# Patient Record
Sex: Male | Born: 2000 | Race: White | Hispanic: No | Marital: Single | Smoking: Light tobacco smoker
Health system: Southern US, Community
[De-identification: ages and names within clinical notes are randomized; demographics above are authoritative.]

## PROBLEM LIST (undated history)

## (undated) DIAGNOSIS — F419 Anxiety disorder, unspecified: Secondary | ICD-10-CM

## (undated) DIAGNOSIS — F329 Major depressive disorder, single episode, unspecified: Secondary | ICD-10-CM

## (undated) DIAGNOSIS — F32A Depression, unspecified: Secondary | ICD-10-CM

## (undated) HISTORY — DX: Depression, unspecified: F32.A

## (undated) HISTORY — DX: Anxiety disorder, unspecified: F41.9

---
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
## 2018-01-03 ENCOUNTER — Other Ambulatory Visit: Payer: Self-pay

## 2018-01-03 ENCOUNTER — Emergency Department
Admission: EM | Admit: 2018-01-03 | Discharge: 2018-01-04 | Disposition: A | Discharge status: Home / Self Care | Payer: Self-pay | Attending: Emergency Medicine | Admitting: Emergency Medicine

## 2018-01-03 ENCOUNTER — Emergency Department: Payer: Self-pay

## 2018-01-03 DIAGNOSIS — R002 Palpitations: Secondary | ICD-10-CM | POA: Insufficient documentation

## 2018-01-03 DIAGNOSIS — F17200 Nicotine dependence, unspecified, uncomplicated: Secondary | ICD-10-CM | POA: Insufficient documentation

## 2018-01-03 LAB — BASIC METABOLIC PANEL
ANION GAP: 9 (ref 5–15)
BUN: 19 mg/dL (ref 6–20)
CALCIUM: 9.3 mg/dL (ref 8.9–10.3)
CO2: 29 mmol/L (ref 22–32)
CREATININE: 1.07 mg/dL (ref 0.61–1.24)
Chloride: 104 mmol/L (ref 98–111)
Glucose, Bld: 93 mg/dL (ref 70–99)
Potassium: 3.8 mmol/L (ref 3.5–5.1)
Sodium: 142 mmol/L (ref 135–145)

## 2018-01-03 LAB — CBC
HCT: 41.6 % (ref 39.0–52.0)
Hemoglobin: 14 g/dL (ref 13.0–17.0)
MCH: 28.4 pg (ref 26.0–34.0)
MCHC: 33.7 g/dL (ref 30.0–36.0)
MCV: 84.4 fL (ref 80.0–100.0)
NRBC: 0 % (ref 0.0–0.2)
PLATELETS: 248 10*3/uL (ref 150–400)
RBC: 4.93 MIL/uL (ref 4.22–5.81)
RDW: 12.1 % (ref 11.5–15.5)
WBC: 8.7 10*3/uL (ref 4.0–10.5)

## 2018-01-03 LAB — TROPONIN I

## 2018-01-03 NOTE — ED Provider Notes (Signed)
[facility] Emergency Department Provider Note  ____________________________________________   First MD Initiated Contact with Patient 01/03/18 2345     (approximate)  I have reviewed the triage vital signs and the nursing notes.   HISTORY  Chief Complaint Chest Pain   HPI Jeff Turner is a 17 y.o. male who self presents to the emergency department with palpitations chest pain shortness of breath bilateral hand numbness that began earlier today.  His symptoms seem to be worse with exertion.  He has had 3 or 4 episodes.  He has not actually passed out.  He has no family history of sudden cardiac death.  When the episodes happen he feels like his heart is racing.  He does have a history of anxiety but this feels different from previous panic attacks.  No history of DVT or pulmonary embolism.  No hemoptysis.  No recent surgery travel or immobilization.  No leg swelling.   History reviewed. No pertinent past medical history.  There are no active problems to display for this patient.   History reviewed. No pertinent surgical history.  Prior to Admission medications   Not on File    Allergies Sulfa antibiotics  History reviewed. No pertinent family history.  Social History Social History   Tobacco Use  . Smoking status: Current Every Day Smoker  Substance Use Topics  . Alcohol use: Not on file  . Drug use: Not on file    Review of Systems Constitutional: No fever/chills Eyes: No visual changes. ENT: No sore throat. Cardiovascular: Positive for chest pain. Respiratory: Positive for shortness of breath. Gastrointestinal: No abdominal pain.  No nausea, no vomiting.  No diarrhea.  No constipation. Genitourinary: Negative for dysuria. Musculoskeletal: Negative for back pain. Skin: Negative for rash. Neurological: Positive for focal numbness   ____________________________________________   PHYSICAL EXAM:  VITAL SIGNS: ED Triage Vitals    Enc Vitals Group     BP 01/03/18 1907 117/68     Pulse Rate 01/03/18 1907 78     Resp 01/03/18 1907 16     Temp 01/03/18 1907 99 F (37.2 C)     Temp Source 01/03/18 1907 Oral     SpO2 01/03/18 1907 98 %     Weight 01/03/18 1908 155 lb (70.3 kg)     Height 01/03/18 1908 5\' 8"  (1.727 m)     Head Circumference --      Peak Flow --      Pain Score 01/03/18 1907 3     Pain Loc --      Pain Edu? --      Excl. in GC? --     Constitutional: Alert and oriented x4 mildly anxious appearing although nontoxic no diaphoresis and speaks in full clear sentences Eyes: PERRL EOMI. Head: Atraumatic. Nose: No congestion/rhinnorhea. Mouth/Throat: No trismus Neck: No stridor.   Cardiovascular: Normal rate, regular rhythm. Grossly normal heart sounds.  Good peripheral circulation.  Specifically no left ear murmur Respiratory: Normal respiratory effort.  No retractions. Lungs CTAB and moving good air Gastrointestinal: Soft nontender Musculoskeletal: No lower extremity edema   Neurologic:  Normal speech and language. No gross focal neurologic deficits are appreciated. Skin:  Skin is warm, dry and intact. No rash noted. Psychiatric: Mildly anxious appearing    ____________________________________________   DIFFERENTIAL includes but not limited to  SVT, atrial fibrillation, ventricular tachycardia, panic attack, pulmonary embolism ____________________________________________   LABS (all labs ordered are listed, but only abnormal results are displayed)  Labs  Reviewed  BASIC METABOLIC PANEL  CBC  TROPONIN I    Lab work reviewed by me with no signs of acute ischemia and normal electrolytes __________________________________________  EKG  ED ECG REPORT I, Merrily Brittle, the attending physician, personally viewed and interpreted this ECG.  Date: 01/03/2018 EKG Time:  Rate: 81 Rhythm: normal sinus rhythm QRS Axis: normal Intervals: normal ST/T Wave abnormalities: normal Narrative  Interpretation: no evidence of acute ischemia  ____________________________________________  RADIOLOGY  Chest x-ray reviewed by me with no acute disease ____________________________________________   PROCEDURES  Procedure(s) performed: no  Procedures  Critical Care performed: no  ____________________________________________   INITIAL IMPRESSION / ASSESSMENT AND PLAN / ED COURSE  Pertinent labs & imaging results that were available during my care of the patient were reviewed by me and considered in my medical decision making (see chart for details).   As part of my medical decision making, I reviewed the following data within the electronic MEDICAL RECORD NUMBER History obtained from family if available, nursing notes, old chart and ekg, as well as notes from prior ED visits.  Patient comes to the emergency department with multiple discrete short-lived episodes of palpitations and lightheadedness.  He has subsequent tingling in his fingers which is likely secondary to hyperventilation.  Had a lengthy discussion with the patient and family regarding the difficulty of diagnosing palpitations without ever catching them on the monitor.  He has no family history of sudden cardiac death.  He has no concerning murmurs.  I placed him on monitor and he was observed for more than an hour with no ectopy captured.  I do think is reasonable and a good idea for him to follow-up with cardiology to consider a Holter monitor.  He understands to return should his symptoms worsen or should he actually pass out.  I asked him to try to keep a record of how often these episodes happen and their duration in addition to checking his pulse when the episodes occur.  This is not as good as a monitor by any means but her palpitations with a heart rate of 80 is quite different from palpitations of the heart rate of 150.  He is discharged with family in improved condition.       ____________________________________________   FINAL CLINICAL IMPRESSION(S) / ED DIAGNOSES  Final diagnoses:  Palpitations      NEW MEDICATIONS STARTED DURING THIS VISIT:  There are no discharge medications for this patient.    Note:  This document was prepared using Dragon voice recognition software and may include unintentional dictation errors.     Merrily Brittle, MD 01/06/18 [phone]

## 2018-01-03 NOTE — ED Triage Notes (Signed)
Pt states "a little bit of everything" when asked to elaborate family member states CP, SOB, numbness in arms. Hx anxiety. Pt states is different. + smoker. A&O, ambulatory. No distress noted.

## 2018-01-04 NOTE — Discharge Instructions (Signed)
Fortunately today your blood work, your EKG, your chest x-ray, and your monitoring was reassuring.  I do not know exactly why you had these palpitations but I think it is important for you to follow-up with a cardiologist for a recheck within a week or so.  Please return to the emergency department sooner for any concerns particularly if your symptoms worsen or if you pass out.  It was a pleasure to take care of you today, and thank you for coming to our emergency department.  If you have any questions or concerns before leaving please ask the nurse to grab me and I'm more than happy to go through your aftercare instructions again.  If you were prescribed any opioid pain medication today such as Norco, Vicodin, Percocet, morphine, hydrocodone, or oxycodone please make sure you do not drive when you are taking this medication as it can alter your ability to drive safely.  If you have any concerns once you are home that you are not improving or are in fact getting worse before you can make it to your follow-up appointment, please do not hesitate to call 911 and come back for further evaluation.  Merrily Brittle, MD  Results for orders placed or performed during the hospital encounter of 01/03/18  Basic metabolic panel  Result Value Ref Range   Sodium 142 135 - 145 mmol/L   Potassium 3.8 3.5 - 5.1 mmol/L   Chloride 104 98 - 111 mmol/L   CO2 29 22 - 32 mmol/L   Glucose, Bld 93 70 - 99 mg/dL   BUN 19 6 - 20 mg/dL   Creatinine, Ser 1.61 0.61 - 1.24 mg/dL   Calcium 9.3 8.9 - 09.6 mg/dL   GFR calc non Af Amer >60 >60 mL/min   GFR calc Af Amer >60 >60 mL/min   Anion gap 9 5 - 15  CBC  Result Value Ref Range   WBC 8.7 4.0 - 10.5 K/uL   RBC 4.93 4.22 - 5.81 MIL/uL   Hemoglobin 14.0 13.0 - 17.0 g/dL   HCT 04.5 40.9 - 81.1 %   MCV 84.4 80.0 - 100.0 fL   MCH 28.4 26.0 - 34.0 pg   MCHC 33.7 30.0 - 36.0 g/dL   RDW 91.4 78.2 - 95.6 %   Platelets 248 150 - 400 K/uL   nRBC 0.0 0.0 - 0.2 %  Troponin I   Result Value Ref Range   Troponin I <0.03 <0.03 ng/mL   Dg Chest 2 View  Result Date: 01/03/2018 CLINICAL DATA:  Chest pain and shortness of breath EXAM: CHEST - 2 VIEW COMPARISON:  None. FINDINGS: The heart size and mediastinal contours are within normal limits. Both lungs are clear. The visualized skeletal structures are unremarkable. IMPRESSION: No active cardiopulmonary disease. Electronically Signed   By: Alcide Clever M.D.   On: 01/03/2018 20:21

## 2018-01-05 ENCOUNTER — Telehealth: Payer: Self-pay

## 2018-01-05 NOTE — Telephone Encounter (Signed)
Lmov for patient to call and schedule ED fu appointment °They were seen on 01/03/18 for CP °

## 2018-01-07 NOTE — Telephone Encounter (Signed)
Lmov for patient to call and schedule ED fu appointment °They were seen on 01/03/18 for CP °

## 2018-01-11 NOTE — Telephone Encounter (Signed)
Lmov for patient to call and schedule ED fu appointment °They were seen on 01/03/18 for CP °

## 2018-01-12 NOTE — Telephone Encounter (Signed)
Letter sent.

## 2018-09-17 ENCOUNTER — Other Ambulatory Visit: Payer: Self-pay

## 2018-09-17 ENCOUNTER — Ambulatory Visit (INDEPENDENT_AMBULATORY_CARE_PROVIDER_SITE_OTHER): Payer: Self-pay | Admitting: Psychiatry

## 2018-09-17 ENCOUNTER — Encounter (HOSPITAL_COMMUNITY): Payer: Self-pay | Admitting: Psychiatry

## 2018-09-17 DIAGNOSIS — F322 Major depressive disorder, single episode, severe without psychotic features: Secondary | ICD-10-CM

## 2018-09-17 DIAGNOSIS — F329 Major depressive disorder, single episode, unspecified: Secondary | ICD-10-CM | POA: Insufficient documentation

## 2018-09-17 MED ORDER — TRAZODONE HCL 50 MG PO TABS: 50.0000 mg | ORAL_TABLET | Freq: Every day | ORAL | 2 refills | Status: AC

## 2018-09-17 MED ORDER — FLUOXETINE HCL 20 MG PO CAPS: 20.0000 mg | ORAL_CAPSULE | Freq: Every day | ORAL | 2 refills | Status: AC

## 2018-09-17 NOTE — Progress Notes (Signed)
Virtual Visit via Video Note  I connected with Jeff HaringKasey R Breunig on 09/17/18 at 11:00 AM EDT by a video enabled telemedicine application and verified that I am speaking with the correct person using two identifiers.   I discussed the limitations of evaluation and management by telemedicine and the availability of in person appointments. The patient expressed understanding and agreed to proceed.      I discussed the assessment and treatment plan with the patient. The patient was provided an opportunity to ask questions and all were answered. The patient agreed with the plan and demonstrated an understanding of the instructions.   The patient was advised to call back or seek an in-person evaluation if the symptoms worsen or if the condition fails to improve as anticipated.  I provided 15 minutes of non-face-to-face time during this encounter.   Diannia Rudereborah Ross, MD   Psychiatric Initial Adult Assessment   Patient Identification: Jeff Turner MRN:  [id number] Date of Evaluation:  09/17/2018 Referral Source: [facility] Chief Complaint:   Chief Complaint    Depression; Anxiety; Establish Care     Visit Diagnosis:    ICD-10-CM   1. Current severe episode of major depressive disorder without psychotic features without prior episode (HCC)  F32.2     History of Present Illness: This patient is a 18 year old white male who lives with his parents and 20 year old brother in [place].  He graduated from [employer] in 2018.  He is currently not in school and is not employed.  The patient was referred by [facility] for further assessment and treatment of depression and anxiety  The patient is seen via video telemedicine and his mother is also present at his request.  The patient states that he has been depressed since approximately age 18.  He is not really sure why.  His mother states that he was a fairly happy child in elementary and middle school but became  more withdrawn and high school.  He states that since last summer his depression has worsened.  He has had some relationship issues and was with a girlfriend for a while and the relationship ended and he felt even worse but now they are back together again.  He states that he often feels depressed and sad.  He stays by himself quite a bit and isolates.  Occasionally goes outside and lays in the hammock.  His sleep is variable sometimes he only sleeps 4 to 5 hours a night and has trouble getting to sleep or wakes up early.  His energy level is very low and he does not feel like doing much of anything.  He does not get much exercise.  He eats okay but he is not very hungry.  He claims to get along well with family and his girlfriend and he has 4-5 other friends.  He states that he is also very anxious and has panic attacks and often wakes up in a panic.  The patient does not have any plans for himself other than perhaps pursuing a welding program at a community college.  He has not really looked into this and leaves it to his mother to help find things for him to do.  He seems totally unmotivated.  He states about a month ago he had a plan to shoot himself because he was tired of being depressed.  He was at his grandparents house where they had firearms but he told his girlfriend and it she called his parents and they  came and picked him up.  Since then he has been going to therapy with a therapist in [place] which he states has helped "a little bit."  He is never had previous psychiatric treatment or counseling.  The patient drinks occasional beer, denies using drugs and is sexually active with his girlfriend.  Associated Signs/Symptoms: Depression Symptoms:  depressed mood, anhedonia, psychomotor retardation, fatigue, difficulty concentrating, suicidal thoughts without plan, anxiety, panic attacks, loss of energy/fatigue, disturbed sleep, (Hypo) Manic Symptoms: Anxiety Symptoms:  Excessive  Worry, Panic Symptoms, Social Anxiety, Psychotic Symptoms:   PTSD Symptoms: No history of trauma or abuse  Past Psychiatric History: none  Previous Psychotropic Medications: No   Substance Abuse History in the last 12 months:  No.  Consequences of Substance Abuse: Negative  Past Medical History:  Past Medical History:  Diagnosis Date  . Anxiety   . Depression    No past surgical history on file.  Family Psychiatric History: none  Family History:  Family History  Problem Relation Age of Onset  . Anxiety disorder Father     Social History:   Social History   Socioeconomic History  . Marital status: Single    Spouse name: Not on file  . Number of children: Not on file  . Years of education: Not on file  . Highest education level: Not on file  Occupational History  . Not on file  Social Needs  . Financial resource strain: Not on file  . Food insecurity    Worry: Not on file    Inability: Not on file  . Transportation needs    Medical: Not on file    Non-medical: Not on file  Tobacco Use  . Smoking status: Light Tobacco Smoker  . Smokeless tobacco: Never Used  Substance and Sexual Activity  . Alcohol use: Yes    Comment: occassional  . Drug use: Never  . Sexual activity: Yes  Lifestyle  . Physical activity    Days per week: Not on file    Minutes per session: Not on file  . Stress: Not on file  Relationships  . Social Musicianconnections    Talks on phone: Not on file    Gets together: Not on file    Attends religious service: Not on file    Active member of club or organization: Not on file    Attends meetings of clubs or organizations: Not on file    Relationship status: Not on file  Other Topics Concern  . Not on file  Social History Narrative  . Not on file    Additional Social History: The patient lives with his parents and brother.  He seems to have a totally unstructured lifestyle.  He is not working or going to school.  He claims he wants to go  to community college but has not made any efforts to do so.  Allergies:   Allergies  Allergen Reactions  . Sulfa Antibiotics     Metabolic Disorder Labs: No results found for: HGBA1C, MPG No results found for: PROLACTIN No results found for: CHOL, TRIG, HDL, CHOLHDL, VLDL, LDLCALC No results found for: TSH  Therapeutic Level Labs: No results found for: LITHIUM No results found for: CBMZ No results found for: VALPROATE  Current Medications: Current Outpatient Medications  Medication Sig Dispense Refill  . FLUoxetine (PROZAC) 20 MG capsule Take 1 capsule (20 mg total) by mouth daily. 30 capsule 2  . traZODone (DESYREL) 50 MG tablet Take 1 tablet (50 mg total) by  mouth at bedtime. 30 tablet 2   No current facility-administered medications for this visit.     Musculoskeletal: Strength & Muscle Tone: within normal limits Gait & Station: normal Patient leans: N/A  Psychiatric Specialty Exam: Review of Systems  Constitutional: Positive for malaise/fatigue.  Psychiatric/Behavioral: Positive for depression and memory loss. The patient is nervous/anxious.   All other systems reviewed and are negative.   There were no vitals taken for this visit.There is no height or weight on file to calculate BMI.  General Appearance: Casual and Fairly Groomed  Eye Contact:  Good  Speech:  Clear and Coherent  Volume:  Decreased  Mood:  Anxious and Depressed  Affect:  Constricted and Flat  Thought Process:  Goal Directed  Orientation:  Full (Time, Place, and Person)  Thought Content:  Rumination  Suicidal Thoughts:  Yes.  without intent/plan  Homicidal Thoughts:  No  Memory:  Immediate;   Good Recent;   Good Remote;   Fair  Judgement:  Poor  Insight:  Lacking  Psychomotor Activity:  Decreased  Concentration:  Concentration: Fair and Attention Span: Fair  Recall:  [employer] of Knowledge:Fair  Language: Good  Akathisia:  No  Handed:  Right  AIMS (if indicated):  not done  Assets:   Communication Skills Desire for Improvement Physical Health Resilience Social Support  ADL's:  Intact  Cognition: WNL  Sleep:  Poor   Screenings:   Assessment and Plan: This patient is a 18 year old male with a history of significant depression and anxiety symptoms.  Right now he is terribly lethargic and unmotivated.  I agree with starting the counseling.  But we will also start Prozac 20 mg every morning for depression and trazodone 50 mg at bedtime to help him sleep.  I urged him to get on a regular sleep schedule and also to start helping out at home and doing some assigned tasks every day.  He will return to see me in 4 weeks   Levonne Spiller, MD 7/17/202011:36 AM

## 2018-12-24 ENCOUNTER — Other Ambulatory Visit: Payer: Self-pay

## 2018-12-24 DIAGNOSIS — Z20822 Contact with and (suspected) exposure to covid-19: Secondary | ICD-10-CM

## 2018-12-26 LAB — NOVEL CORONAVIRUS, NAA: SARS-CoV-2, NAA: NOT DETECTED

## 2020-03-31 IMAGING — CR DG CHEST 2V
2 series · 2 of 2 positions shown · non-contrast
Comparison: None.

CLINICAL DATA: Chest pain and shortness of breath

EXAM:
CHEST - 2 VIEW

[chest pa]
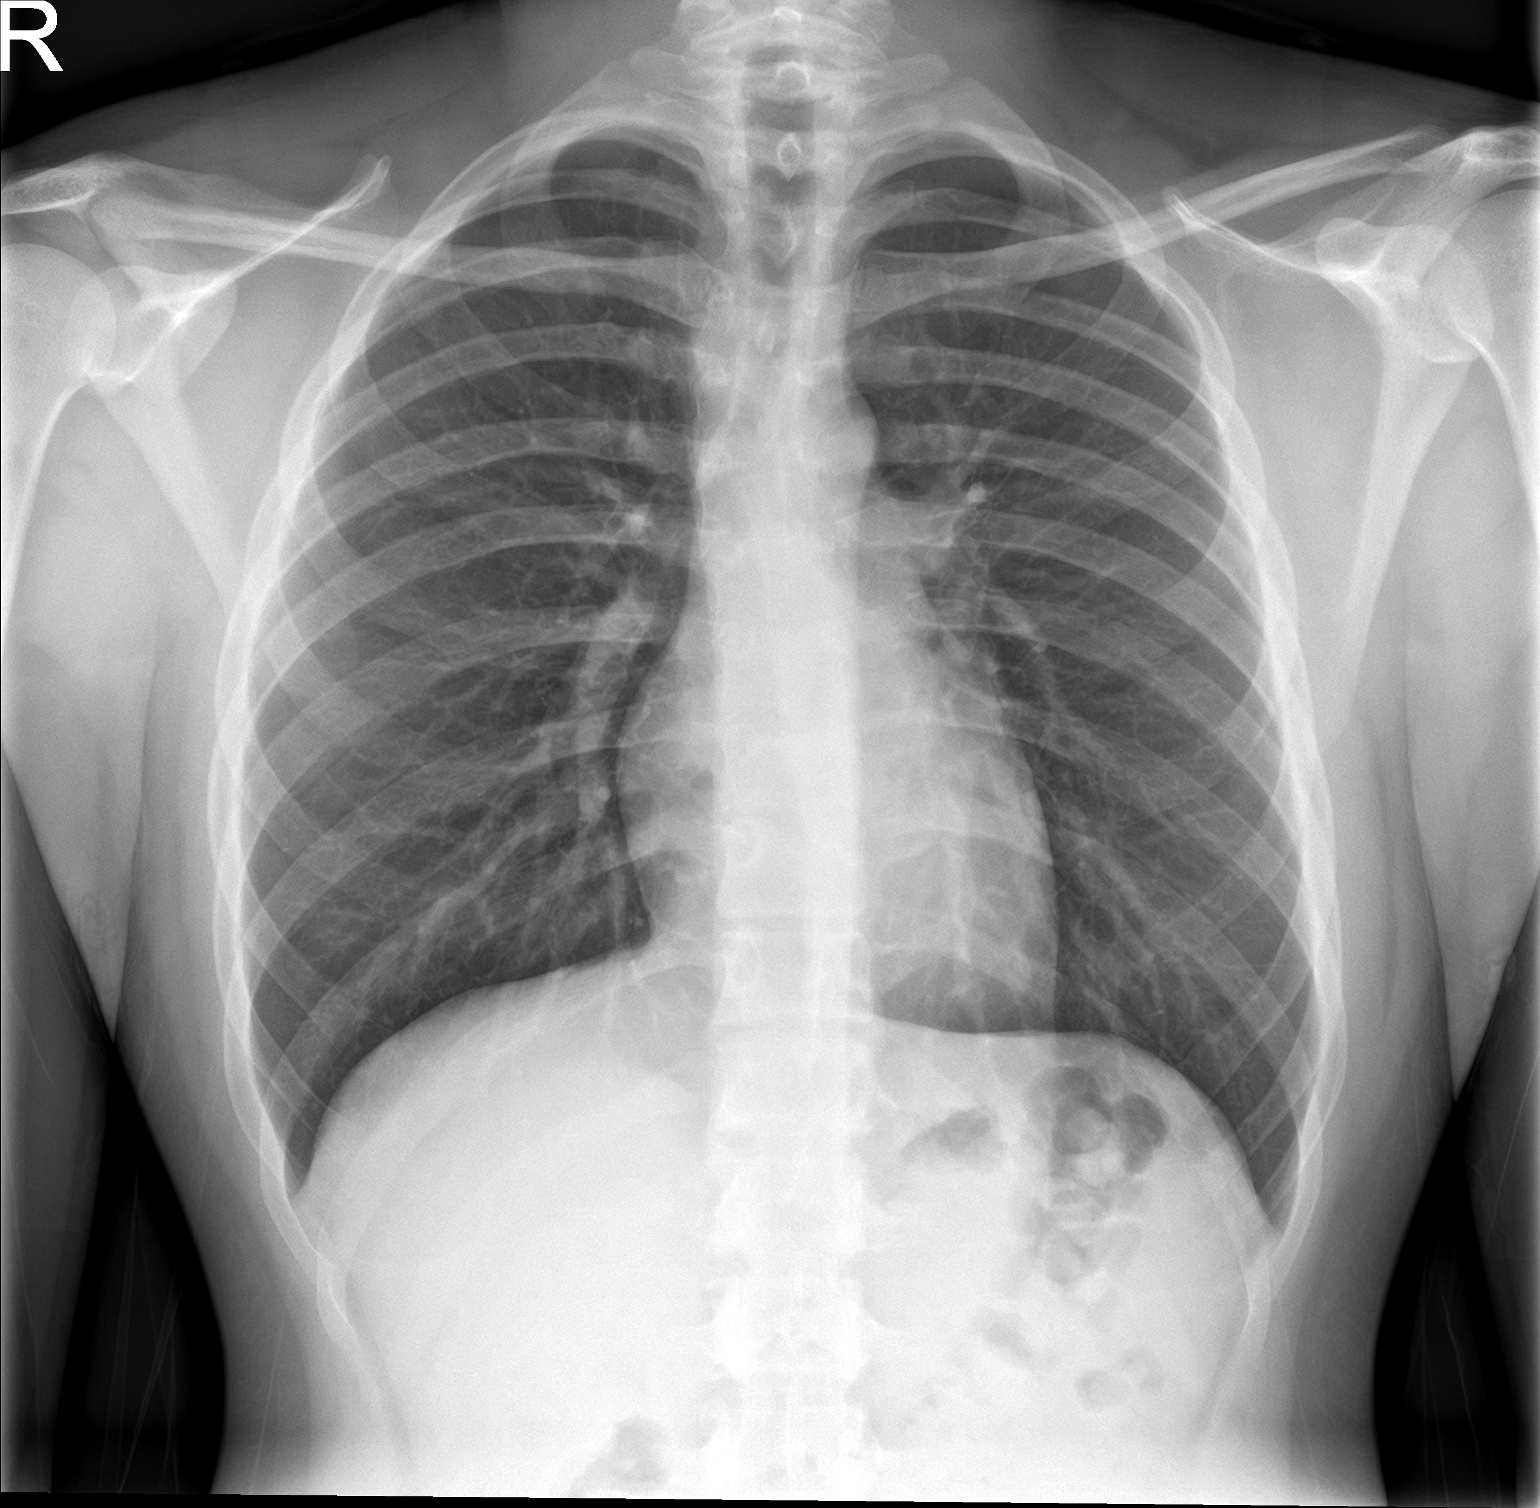

[chest lat]
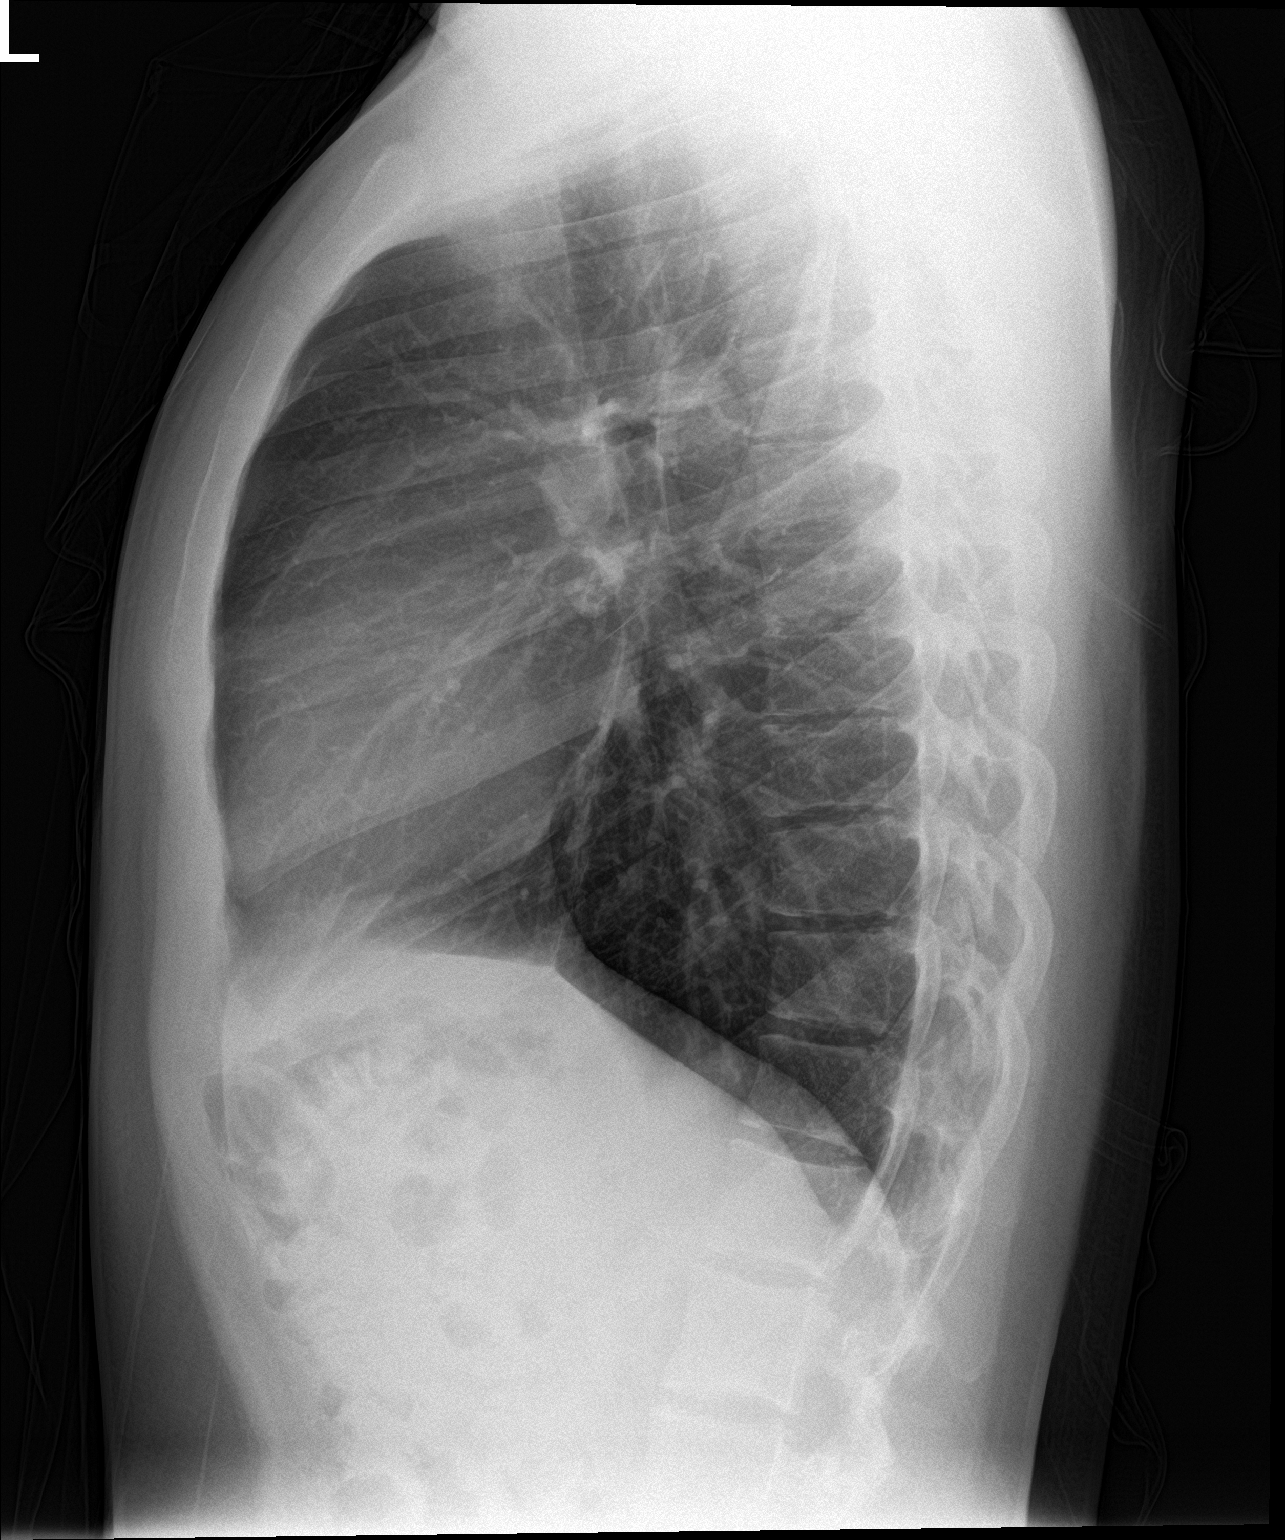

[2 of 2 positions shown; findings below may reference images not displayed]

FINDINGS: The heart size and mediastinal contours are within normal limits.
Both lungs are clear. The visualized skeletal structures are
unremarkable.
IMPRESSION: No active cardiopulmonary disease.
